# Patient Record
Sex: Male | Born: 1985 | Hispanic: Yes | Marital: Married | Smoking: Never smoker
Health system: Southern US, Community
[De-identification: ages and names within clinical notes are randomized; demographics above are authoritative.]

## PROBLEM LIST (undated history)

## (undated) DIAGNOSIS — F419 Anxiety disorder, unspecified: Secondary | ICD-10-CM

## (undated) DIAGNOSIS — I10 Essential (primary) hypertension: Secondary | ICD-10-CM

## (undated) HISTORY — PX: OTHER SURGICAL HISTORY: SHX169

---

## 2020-04-23 ENCOUNTER — Emergency Department
Admission: EM | Admit: 2020-04-23 | Discharge: 2020-04-23 | Disposition: A | Discharge status: Home / Self Care | Payer: Self-pay | Attending: Emergency Medicine | Admitting: Emergency Medicine

## 2020-04-23 ENCOUNTER — Encounter: Payer: Self-pay | Admitting: Emergency Medicine

## 2020-04-23 ENCOUNTER — Other Ambulatory Visit: Payer: Self-pay

## 2020-04-23 DIAGNOSIS — Z79899 Other long term (current) drug therapy: Secondary | ICD-10-CM | POA: Insufficient documentation

## 2020-04-23 DIAGNOSIS — R42 Dizziness and giddiness: Secondary | ICD-10-CM | POA: Insufficient documentation

## 2020-04-23 DIAGNOSIS — I10 Essential (primary) hypertension: Secondary | ICD-10-CM

## 2020-04-23 DIAGNOSIS — Z760 Encounter for issue of repeat prescription: Secondary | ICD-10-CM

## 2020-04-23 DIAGNOSIS — R519 Headache, unspecified: Secondary | ICD-10-CM

## 2020-04-23 HISTORY — DX: Essential (primary) hypertension: I10

## 2020-04-23 MED ORDER — LOSARTAN POTASSIUM 50 MG PO TABS
50.0000 mg | ORAL_TABLET | Freq: Once | ORAL | Status: AC
  Administered 2020-04-23: 50 mg via ORAL
  Filled 2020-04-23: qty 1

## 2020-04-23 MED ORDER — LOSARTAN POTASSIUM 50 MG PO TABS: 50.0000 mg | ORAL_TABLET | Freq: Every day | ORAL | 11 refills | Status: DC

## 2020-04-23 NOTE — ED Notes (Addendum)
In person interpreter requested for assessment. Unable to perform assessment at this time

## 2020-04-23 NOTE — ED Provider Notes (Signed)
[facility] Emergency Department Provider Note  ____________________________________________  Time seen: Approximately 3:11 PM  I have reviewed the triage vital signs and the nursing notes.   HISTORY  Chief Complaint Medication Refill  Interpreter was used  HPI Leanard Kemp is a 34 y.o. male who presents the emergency department complaining of mild headache, dizziness, high blood pressure.  Patient states that he typically gets headache and dizziness when his blood pressure is up.  He states that he takes losartan, 50 mg daily for his blood pressure.  He states that he has been out of his medicine for 2 weeks and he feels that this is likely causing his headache.  Patient denies any other complaints of fevers or chills, nasal congestion, sore throat, cough, shortness of breath, chest pain, abdominal pain, nausea vomiting.  Patient is requesting refill of his blood pressure medication at this time.         Past Medical History:  Diagnosis Date  . Hypertension     There are no problems to display for this patient.   History reviewed. No pertinent surgical history.  Prior to Admission medications   Medication Sig Start Date End Date Taking? Authorizing Provider  losartan (COZAAR) 50 MG tablet Take 1 tablet (50 mg total) by mouth daily. 04/23/20 04/23/21  Tonji Elliff, Delorise Royals, PA-C    Allergies Patient has no known allergies.  History reviewed. No pertinent family history.  Social History Social History   Tobacco Use  . Smoking status: Never Smoker  . Smokeless tobacco: Never Used  Substance Use Topics  . Alcohol use: Not Currently  . Drug use: Not on file     Review of Systems  Constitutional: No fever/chills Eyes: No visual changes. No discharge ENT: No upper respiratory complaints. Cardiovascular: no chest pain.  Hypertension, out of blood pressure medication Respiratory: no cough. No SOB. Gastrointestinal: No abdominal pain.   No nausea, no vomiting.  No diarrhea.  No constipation. Musculoskeletal: Negative for musculoskeletal pain. Skin: Negative for rash, abrasions, lacerations, ecchymosis. Neurological: Positive for headache but denies focal weakness or numbness.  10 System ROS otherwise negative.  ____________________________________________   PHYSICAL EXAM:  VITAL SIGNS: ED Triage Vitals [04/23/20 1450]  Enc Vitals Group     BP (!) 160/117     Pulse Rate 74     Resp 18     Temp 98.6 F (37 C)     Temp Source Oral     SpO2 100 %     Weight 224 lb 13.9 oz (102 kg)     Height 6' 0.84" (1.85 m)     Head Circumference      Peak Flow      Pain Score 0     Pain Loc      Pain Edu?      Excl. in GC?      Constitutional: Alert and oriented. Well appearing and in no acute distress. Eyes: Conjunctivae are normal. PERRL. EOMI. Head: Atraumatic. ENT:      Ears:       Nose: No congestion/rhinnorhea.      Mouth/Throat: Mucous membranes are moist.  Neck: No stridor.  No cervical spine tenderness to palpation. Hematological/Lymphatic/Immunilogical: No cervical lymphadenopathy. Cardiovascular: Normal rate, regular rhythm. Normal S1 and S2.  Good peripheral circulation. Respiratory: Normal respiratory effort without tachypnea or retractions. Lungs CTAB. Good air entry to the bases with no decreased or absent breath sounds. Musculoskeletal: Full range of motion to all extremities. No gross  deformities appreciated. Neurologic:  Normal speech and language. No gross focal neurologic deficits are appreciated.  Cranial nerves II through XII grossly intact.  Negative Romberg's and pronator drift.  Equal grip strength in the upper extremities. Skin:  Skin is warm, dry and intact. No rash noted. Psychiatric: Mood and affect are normal. Speech and behavior are normal. Patient exhibits appropriate insight and judgement.   ____________________________________________   LABS (all labs ordered are listed, but only  abnormal results are displayed)  Labs Reviewed - No data to display ____________________________________________  EKG   ____________________________________________  RADIOLOGY   No results found.  ____________________________________________    PROCEDURES  Procedure(s) performed:    Procedures    Medications  losartan (COZAAR) tablet 50 mg (has no administration in time range)     ____________________________________________   INITIAL IMPRESSION / ASSESSMENT AND PLAN / ED COURSE  Pertinent labs & imaging results that were available during my care of the patient were reviewed by me and considered in my medical decision making (see chart for details).  Review of the [place] CSRS was performed in accordance of the [facility] prior to dispensing any controlled drugs.           Patient's diagnosis is consistent with headache, hypertension.  Patient presents emergency department complaining of headache and high blood pressure.  Patient states that he gets a headache when he does not take his blood pressure medications.  Patient is out of his losartan for the past 2 weeks.  No recent head trauma.  No other signs or symptoms.  Neuro exam is reassuring.  No indication for labs or imaging currently.  Patient will be placed on his losartan dose of 50 mg daily.  First dose of medicine given here.  Patient did have an elevated blood pressure of 160/117.  No evidence of hypertensive urgency or emergency on physical exam or based off of symptoms..  Patient will be prescribed losartan with 11 refills.  Follow-up with primary care.  Patient is given ED precautions to return to the ED for any worsening or new symptoms.     ____________________________________________  FINAL CLINICAL IMPRESSION(S) / ED DIAGNOSES  Final diagnoses:  Primary hypertension  Encounter for medication refill  Acute nonintractable headache, unspecified headache type      NEW MEDICATIONS STARTED DURING THIS  VISIT:  ED Discharge Orders         Ordered    losartan (COZAAR) 50 MG tablet  Daily        04/23/20 1519              This chart was dictated using voice recognition software/Dragon. Despite best efforts to proofread, errors can occur which can change the meaning. Any change was purely unintentional.    Racheal Patches, PA-C 04/23/20 1520    Arnaldo Natal, MD 04/23/20 [phone]

## 2020-04-23 NOTE — ED Triage Notes (Signed)
Pt comes into the ED via POV c/o need for BP medication refill.  Pt states he has been out for 2 weeks.

## 2021-06-15 ENCOUNTER — Emergency Department (HOSPITAL_COMMUNITY)
Admission: EM | Admit: 2021-06-15 | Discharge: 2021-06-16 | Disposition: A | Discharge status: Home / Self Care | Payer: Self-pay | Attending: Emergency Medicine | Admitting: Emergency Medicine

## 2021-06-15 ENCOUNTER — Encounter (HOSPITAL_COMMUNITY): Payer: Self-pay

## 2021-06-15 ENCOUNTER — Other Ambulatory Visit: Payer: Self-pay

## 2021-06-15 DIAGNOSIS — Z79899 Other long term (current) drug therapy: Secondary | ICD-10-CM | POA: Insufficient documentation

## 2021-06-15 DIAGNOSIS — I10 Essential (primary) hypertension: Secondary | ICD-10-CM | POA: Insufficient documentation

## 2021-06-15 DIAGNOSIS — I158 Other secondary hypertension: Secondary | ICD-10-CM | POA: Insufficient documentation

## 2021-06-15 LAB — BASIC METABOLIC PANEL
Anion gap: 12 (ref 5–15)
BUN: 15 mg/dL (ref 6–20)
CO2: 23 mmol/L (ref 22–32)
Calcium: 9.2 mg/dL (ref 8.9–10.3)
Chloride: 102 mmol/L (ref 98–111)
Creatinine, Ser: 0.88 mg/dL (ref 0.61–1.24)
GFR, Estimated: >60 mL/min (ref >60)
Glucose, Bld: 85 mg/dL (ref 70–99)
Potassium: 3.5 mmol/L (ref 3.5–5.1)
Sodium: 137 mmol/L (ref 135–145)

## 2021-06-15 LAB — CBC WITH DIFFERENTIAL/PLATELET
Abs Immature Granulocytes: 0.03 10*3/uL (ref 0.00–0.07)
Basophils Absolute: 0.2 10*3/uL — ABNORMAL HIGH (ref 0.0–0.1)
Basophils Relative: 2 %
Eosinophils Absolute: 0.1 10*3/uL (ref 0.0–0.5)
Eosinophils Relative: 1 %
HCT: 51.1 % (ref 39.0–52.0)
Hemoglobin: 17.1 g/dL — ABNORMAL HIGH (ref 13.0–17.0)
Immature Granulocytes: 0 %
Lymphocytes Relative: 31 %
Lymphs Abs: 2.7 10*3/uL (ref 0.7–4.0)
MCH: 29 pg (ref 26.0–34.0)
MCHC: 33.5 g/dL (ref 30.0–36.0)
MCV: 86.6 fL (ref 80.0–100.0)
Monocytes Absolute: 0.6 10*3/uL (ref 0.1–1.0)
Monocytes Relative: 6 %
Neutro Abs: 5.1 10*3/uL (ref 1.7–7.7)
Neutrophils Relative %: 60 %
Platelets: 341 10*3/uL (ref 150–400)
RBC: 5.9 MIL/uL — ABNORMAL HIGH (ref 4.22–5.81)
RDW: 12.6 % (ref 11.5–15.5)
WBC: 8.7 10*3/uL (ref 4.0–10.5)
nRBC: 0 % (ref 0.0–0.2)

## 2021-06-15 NOTE — ED Provider Triage Note (Signed)
Emergency Medicine Provider Triage Evaluation Note  Christine Schiefelbein , a 36 y.o. male  was evaluated in triage.  Pt complains of elevated blood pressure since being out of his medications x1 month.  States that he takes 50 mg losartan.  He does not have a PCP to follow-up with.  He states that he was prescribed high blood pressure medication from the hospital after moving here from [place].  Chart review he was seen at [facility] on 04/23/2020 and prescribed 11 refills of losartan.  He states he has intermittent lightheadedness however denies any headache, vision changes, room spinning dizziness, unilateral weakness or numbness, chest pain, shortness of breath.   Review of Systems  Positive: + elevated BP, lightheadedness Negative: - chest pain, SOB, headache, vision changes  Physical Exam  BP (!) 175/112    Pulse 86    Temp 98.2 F (36.8 C)    Resp 16    Ht 6' 0.84" (1.85 m)    Wt 112 kg    SpO2 99%    BMI 32.72 kg/m  Gen:   Awake, no distress   Resp:  Normal effort  MSK:   Moves extremities without difficulty  Other:    Medical Decision Making  Medically screening exam initiated at 5:41 PM.  Appropriate orders placed.  Irvin Bastin was informed that the remainder of the evaluation will be completed by another provider, this initial triage assessment does not replace that evaluation, and the importance of remaining in the ED until their evaluation is complete.     Tanda Rockers, PA-C 06/15/21 1744

## 2021-06-15 NOTE — ED Triage Notes (Signed)
Patient c/o hypertension. Patient states he has been out of his BP medication x 1 month. Patient takes Losartan 50 mg daily. Patient states his prescription expired. Patient also c/o dizziness and fatigue.

## 2021-06-16 MED ORDER — LOSARTAN POTASSIUM 25 MG PO TABS
50.0000 mg | ORAL_TABLET | Freq: Once | ORAL | Status: AC
  Administered 2021-06-16: 50 mg via ORAL
  Filled 2021-06-16: qty 2

## 2021-06-16 MED ORDER — LOSARTAN POTASSIUM 50 MG PO TABS: 50.0000 mg | ORAL_TABLET | Freq: Every day | ORAL | 0 refills | Status: DC

## 2021-06-16 NOTE — ED Provider Notes (Signed)
[facility]-EMERGENCY DEPT Provider Note  CSN: [id number] Arrival date & time: 06/15/21 1717  Chief Complaint(s) Hypertension  HPI Jordan Kemp is a 36 y.o. male without insurance and a history of hypertension who is been off of his Cozaar for the past 20 to 30 days due to not having access to healthcare.  Patient is here for elevated blood pressures.  Denies any headache, nausea, vomiting, chest pain, shortness of breath.  Reports that he tried calling community care clinics to get an appointment but was unable to get 1 until March.   Hypertension   Past Medical History Past Medical History:  Diagnosis Date   Hypertension    There are no problems to display for this patient.  Home Medication(s) Prior to Admission medications   Medication Sig Start Date End Date Taking? Authorizing Provider  losartan (COZAAR) 50 MG tablet Take 1 tablet (50 mg total) by mouth daily. 06/16/21 09/14/21  Nira Conn, MD                                                                                                                                    Allergies Patient has no known allergies.  Review of Systems Review of Systems As noted in HPI  Physical Exam Vital Signs  I have reviewed the triage vital signs BP (!) 152/100    Pulse (!) 58    Temp 98.2 F (36.8 C)    Resp 16 Comment: Simultaneous filing. User may not have seen previous data.   Ht 6' 0.84" (1.85 m)    Wt 112 kg    SpO2 99%    BMI 32.72 kg/m   Physical Exam Vitals reviewed.  Constitutional:      General: He is not in acute distress.    Appearance: He is well-developed. He is not diaphoretic.  HENT:     Head: Normocephalic and atraumatic.     Nose: Nose normal.  Eyes:     General: No scleral icterus.       Right eye: No discharge.        Left eye: No discharge.     Conjunctiva/sclera: Conjunctivae normal.     Pupils: Pupils are equal, round, and reactive to light.  Cardiovascular:      Rate and Rhythm: Normal rate and regular rhythm.     Heart sounds: No murmur heard.   No friction rub. No gallop.  Pulmonary:     Effort: Pulmonary effort is normal. No respiratory distress.     Breath sounds: Normal breath sounds. No stridor. No rales.  Abdominal:     General: There is no distension.     Palpations: Abdomen is soft.     Tenderness: There is no abdominal tenderness.  Musculoskeletal:        General: No tenderness.     Cervical back: Normal range of motion and neck supple.  Skin:  General: Skin is warm and dry.     Findings: No erythema or rash.  Neurological:     Mental Status: He is alert and oriented to person, place, and time.    ED Results and Treatments Labs (all labs ordered are listed, but only abnormal results are displayed) Labs Reviewed  CBC WITH DIFFERENTIAL/PLATELET - Abnormal; Notable for the following components:      Result Value   RBC 5.90 (*)    Hemoglobin 17.1 (*)    Basophils Absolute 0.2 (*)    All other components within normal limits  BASIC METABOLIC PANEL                                                                                                                         EKG  EKG Interpretation  Date/Time:    Ventricular Rate:    PR Interval:    QRS Duration:   QT Interval:    QTC Calculation:   R Axis:     Text Interpretation:         Radiology No results found.  Pertinent labs & imaging results that were available during my care of the patient were reviewed by me and considered in my medical decision making (see MDM for details).  Medications Ordered in ED Medications  losartan (COZAAR) tablet 50 mg (50 mg Oral Given 06/16/21 0205)                                                                                                                                     Procedures Procedures  (including critical care time)  Medical Decision Making / ED Course        Hypertension Asymptomatic Usually controlled  with 50 mg of Cozaar.  Has been out of it for the past 20 to 30 days due to lack of access to healthcare. Patient seen in the MSE process and screening labs obtain to assess for any renal sufficiency that would be concerning for endorgan damage.  Work-up ordered to assess concerns above.  Labs independently interpreted by me and noted below: BMP reassuring without renal insufficiency or electrolyte derangement  Management: Given her oral dose of his home medication Stable for discharge home with a prescription until March.  Reassessment: BP improved   Final Clinical Impression(s) / ED Diagnoses Final diagnoses:  Other secondary hypertension   The patient appears reasonably screened and/or stabilized for  discharge and I doubt any other medical condition or other [facility] requiring further screening, evaluation, or treatment in the ED at this time prior to discharge. Safe for discharge with strict return precautions.  Disposition: Discharge  Condition: Good  I have discussed the results, Dx and Tx plan with the patient/family who expressed understanding and agree(s) with the plan. Discharge instructions discussed at length. The patient/family was given strict return precautions who verbalized understanding of the instructions. No further questions at time of discharge.    ED Discharge Orders          Ordered    losartan (COZAAR) 50 MG tablet  Daily        06/16/21 0215             Follow Up: [facility] [address] [place] [id number] [phone]  Schedule an appointment as soon as possible for a visit  to schedule an appointment for close follow up           This chart was dictated using voice recognition software.  Despite best efforts to proofread,  errors can occur which can change the documentation meaning.    Nira Connardama, Cyenna Rebello Eduardo, MD 06/16/21 [phone]

## 2021-08-21 ENCOUNTER — Emergency Department: Payer: Self-pay

## 2021-08-21 ENCOUNTER — Encounter: Payer: Self-pay | Admitting: Intensive Care

## 2021-08-21 ENCOUNTER — Emergency Department
Admission: EM | Admit: 2021-08-21 | Discharge: 2021-08-21 | Disposition: A | Discharge status: Home / Self Care | Payer: Self-pay | Attending: Emergency Medicine | Admitting: Emergency Medicine

## 2021-08-21 ENCOUNTER — Other Ambulatory Visit: Payer: Self-pay

## 2021-08-21 DIAGNOSIS — I10 Essential (primary) hypertension: Secondary | ICD-10-CM | POA: Insufficient documentation

## 2021-08-21 DIAGNOSIS — F419 Anxiety disorder, unspecified: Secondary | ICD-10-CM | POA: Insufficient documentation

## 2021-08-21 DIAGNOSIS — Z711 Person with feared health complaint in whom no diagnosis is made: Secondary | ICD-10-CM | POA: Insufficient documentation

## 2021-08-21 DIAGNOSIS — K2960 Other gastritis without bleeding: Secondary | ICD-10-CM | POA: Insufficient documentation

## 2021-08-21 LAB — HEPATIC FUNCTION PANEL
ALT: 102 U/L — ABNORMAL HIGH (ref 0–44)
AST: 49 U/L — ABNORMAL HIGH (ref 15–41)
Albumin: 4.4 g/dL (ref 3.5–5.0)
Alkaline Phosphatase: 83 U/L (ref 38–126)
Bilirubin, Direct: <0.1 mg/dL (ref 0.0–0.2)
Total Bilirubin: 0.6 mg/dL (ref 0.3–1.2)
Total Protein: 8.1 g/dL (ref 6.5–8.1)

## 2021-08-21 LAB — HEMOGLOBIN A1C
Hgb A1c MFr Bld: 5.5 % (ref 4.8–5.6)
Mean Plasma Glucose: 111.15 mg/dL

## 2021-08-21 LAB — BASIC METABOLIC PANEL
Anion gap: 9 (ref 5–15)
BUN: 13 mg/dL (ref 6–20)
CO2: 25 mmol/L (ref 22–32)
Calcium: 8.9 mg/dL (ref 8.9–10.3)
Chloride: 107 mmol/L (ref 98–111)
Creatinine, Ser: 0.92 mg/dL (ref 0.61–1.24)
GFR, Estimated: >60 mL/min (ref >60)
Glucose, Bld: 121 mg/dL — ABNORMAL HIGH (ref 70–99)
Potassium: 3.9 mmol/L (ref 3.5–5.1)
Sodium: 141 mmol/L (ref 135–145)

## 2021-08-21 LAB — URINALYSIS, ROUTINE W REFLEX MICROSCOPIC
Bilirubin Urine: NEGATIVE
Glucose, UA: NEGATIVE mg/dL
Hgb urine dipstick: NEGATIVE
Ketones, ur: NEGATIVE mg/dL
Leukocytes,Ua: NEGATIVE
Nitrite: NEGATIVE
Protein, ur: 100 mg/dL — AB
Specific Gravity, Urine: 1.026 (ref 1.005–1.030)
pH: 7 (ref 5.0–8.0)

## 2021-08-21 LAB — CBC
HCT: 47.4 % (ref 39.0–52.0)
Hemoglobin: 15.6 g/dL (ref 13.0–17.0)
MCH: 28.4 pg (ref 26.0–34.0)
MCHC: 32.9 g/dL (ref 30.0–36.0)
MCV: 86.3 fL (ref 80.0–100.0)
Platelets: 401 10*3/uL — ABNORMAL HIGH (ref 150–400)
RBC: 5.49 MIL/uL (ref 4.22–5.81)
RDW: 12.8 % (ref 11.5–15.5)
WBC: 10.2 10*3/uL (ref 4.0–10.5)
nRBC: 0 % (ref 0.0–0.2)

## 2021-08-21 LAB — TSH: TSH: 1.241 u[IU]/mL (ref 0.350–4.500)

## 2021-08-21 LAB — TROPONIN I (HIGH SENSITIVITY)
Troponin I (High Sensitivity): 4 ng/L (ref <18)
Troponin I (High Sensitivity): 4 ng/L (ref <18)

## 2021-08-21 MED ORDER — ALPRAZOLAM 0.25 MG PO TABS: 0.2500 mg | ORAL_TABLET | Freq: Three times a day (TID) | ORAL | 0 refills | Status: AC | PRN

## 2021-08-21 MED ORDER — AMLODIPINE BESYLATE 5 MG PO TABS
5.0000 mg | ORAL_TABLET | Freq: Once | ORAL | Status: AC
  Administered 2021-08-21: 5 mg via ORAL
  Filled 2021-08-21: qty 1

## 2021-08-21 MED ORDER — IOHEXOL 350 MG/ML SOLN
100.0000 mL | Freq: Once | INTRAVENOUS | Status: AC | PRN
  Administered 2021-08-21: 100 mL via INTRAVENOUS
  Filled 2021-08-21: qty 100

## 2021-08-21 MED ORDER — LOSARTAN POTASSIUM 50 MG PO TABS: 50.0000 mg | ORAL_TABLET | Freq: Every day | ORAL | 6 refills | Status: AC

## 2021-08-21 MED ORDER — AMLODIPINE BESYLATE 5 MG PO TABS: 5.0000 mg | ORAL_TABLET | Freq: Every day | ORAL | 6 refills | Status: DC

## 2021-08-21 MED ORDER — FAMOTIDINE 40 MG PO TABS: 40.0000 mg | ORAL_TABLET | Freq: Every day | ORAL | 2 refills | Status: AC

## 2021-08-21 NOTE — ED Triage Notes (Addendum)
Spanish speaking only. Patient reports checking his blood pressure today and it was elevated. Seen at [facility] yesterday and sent home. Was given PO K for slightly low results and states he was told he has liver infection. Patient c/o palpitations in triage. ?

## 2021-08-21 NOTE — ED Notes (Signed)
Pt verbalized understanding of discharge instructions, prescription medications, and follow-up care instructions. Pt advised if symptoms worsen to return to ED. Spanish interpreter was used in discharge teaching.  ?

## 2021-08-21 NOTE — ED Provider Notes (Signed)
? ? ?[facility] ?Emergency Department Provider Note ? ? ? ? Event Date/Time  ? First MD Initiated Contact with Patient 08/21/21 1536   ?  (approximate) ? ? ?History  ? ?Hypertension and Palpitations ? ? ?HPI ? ?History limited by [place] language.  Interpreter Rafael present for interview and exam. ? ?Jordan Kemp is a 36 y.o. male with a history of hypertension, presents to the ED accompanied by his wife.  Patient reports an episode yesterday where he experienced some spasms in his hands and legs, and some numbness across his extremities.  This occurred while he was en route to the hospital in [place], via personal vehicle.  Patient was evaluated in the ED there and found to have chemistry panel revealing slight hyponatremia at 133, and a potassium slightly decreased at 3.3.  Patient was also noted to have elevated liver enzymes with ALT/AST at 111/51, respectively.  Patient has been referred to a GI specialist for follow-up of those labs.  He denies being a regular everyday drinker.  He also denies any excessive use of Tylenol.  Patient also reports some episodes of heart palpitations that seem to occur when he was urinating.  He has been taking losartan for his blood pressure since relocating to the area from [place].  He does note that while he was in [place] he took 100 mg of losartan every day, but since being in the states, is only taken 50 mg daily.  Chart review reveals several ED visits for blood pressure medicine refill.  Patient reports he has just establish care with a local primary care provider, but has not been seen by that provider at this time.  Denies any recent fevers, chills, or sweats.  Denies any chest pain or shortness of breath.  Patient voices some concern at this time for recurrence of the episode experienced yesterday.  This was likely an episode of muscle spasm secondary to his electrolyte abnormality.  Patient received a fluid bolus yesterday along with  a dose of his blood pressure medicine and potassium p.o.  He returns to the ED today after noticing some elevated blood pressure readings today. ? ? ?Physical Exam  ? ?Triage Vital Signs: ?ED Triage Vitals  ?Enc Vitals Group  ?   BP 08/21/21 1524 (!) 173/99  ?   Pulse Rate 08/21/21 1524 89  ?   Resp 08/21/21 1524 18  ?   Temp 08/21/21 1524 99.2 ?F (37.3 ?C)  ?   Temp Source 08/21/21 1524 Oral  ?   SpO2 08/21/21 1524 97 %  ?   Weight 08/21/21 1525 256 lb (116.1 kg)  ?   Height 08/21/21 1525 6' 0.84" (1.85 m)  ?   Head Circumference --   ?   Peak Flow --   ?   Pain Score 08/21/21 1524 6  ?   Pain Loc --   ?   Pain Edu? --   ?   Excl. in GC? --   ? ? ?Most recent vital signs: ?Vitals:  ? 08/21/21 2221 08/21/21 2245  ?BP:  (!) 150/92  ?Pulse: 86 86  ?Resp:  18  ?Temp:    ?SpO2: 100% 98%  ? ? ?General Awake, no distress. Appears diaphoretic ?HEENT NCAT. PERRL. EOMI. No rhinorrhea. Mucous membranes are moist. ?CV:  Good peripheral perfusion. RRR ?RESP:  Normal effort. CTA ?ABD:  No distention. Soft, mildly tender to palp over the RUQ & RLQ. Normal bowel sounds. No rebound, guarding, or rigidity.  ? ? ?  ED Results / Procedures / Treatments  ? ?Labs ?(all labs ordered are listed, but only abnormal results are displayed) ?Labs Reviewed  ?BASIC METABOLIC PANEL - Abnormal; Notable for the following components:  ?    Result Value  ? Glucose, Bld 121 (*)   ? All other components within normal limits  ?CBC - Abnormal; Notable for the following components:  ? Platelets 401 (*)   ? All other components within normal limits  ?URINALYSIS, ROUTINE W REFLEX MICROSCOPIC - Abnormal; Notable for the following components:  ? Color, Urine YELLOW (*)   ? APPearance HAZY (*)   ? Protein, ur 100 (*)   ? Bacteria, UA RARE (*)   ? All other components within normal limits  ?HEPATIC FUNCTION PANEL - Abnormal; Notable for the following components:  ? AST 49 (*)   ? ALT 102 (*)   ? All other components within normal limits  ?TSH  ?HEMOGLOBIN A1C   ?TROPONIN I (HIGH SENSITIVITY)  ?TROPONIN I (HIGH SENSITIVITY)  ? ? ? ?EKG ? ?Vent. rate 83 BPM ?PR interval 154 ms ?QRS duration 100 ms ?QT/QTcB 372/437 ms ?P-R-T axes 55 39 -22 ?No STEMI ? ?RADIOLOGY ? ?I personally viewed and evaluated these images as part of my medical decision making, as well as reviewing the written report by the radiologist. ? ?ED Provider Interpretation: no acute findings} ? ?CXR ? ?IMPRESSION: ?No active cardiopulmonary disease. ? ?US ABD - LIMITED (RUQ) ? ?IMPRESSION: ?Hepatic steatosis. No other significant abnormalities. No cause for ?pain identified ? ?CT ABD/PELVIS w CM ? ?IMPRESSION: ?1. No acute abdominal or pelvic CT findings. ?2. Mildly prominent mildly steatotic liver. No intrahepatic fluid ?collection or mass enhancement. No biliary dilatation. ?3. Constipation with fecal back-up into the terminal ileum. No bowel ?obstruction or inflammation. Uncomplicated diverticulosis. ?4. Edematous umbilical fat hernia.  No bowel incarceration. ?5. Chronic L5 pars defects, grade 1 L5-S1 spondylolisthesis, and ?moderate to severe foraminal stenosis with inferior foraminal ?bulging of the disc on both sides. ? ?PROCEDURES: ? ?Critical Care performed: yes ? ?CRITICAL CARE ?Performed by: Lissa HoardJenise V Bacon-Jeric Slagel ? ? ?Total critical care time: 30 minutes ? ?Critical care time was exclusive of separately billable procedures and treating other patients. ? ?Critical care was necessary to treat or prevent imminent or life-threatening deterioration. ? ?Critical care was time spent personally by me on the following activities: development of treatment plan with patient and/or surrogate as well as nursing, discussions with consultants, evaluation of patient's response to treatment, examination of patient, obtaining history from patient or surrogate, ordering and performing treatments and interventions, ordering and review of laboratory studies, ordering and review of radiographic studies, pulse oximetry  and re-evaluation of patient's condition. ? ? ?Procedures ? ? ?MEDICATIONS ORDERED IN ED: ?Medications  ?iohexol (OMNIPAQUE) 350 MG/ML injection 100 mL (100 mLs Intravenous Contrast Given 08/21/21 2024)  ?amLODipine (NORVASC) tablet 5 mg (5 mg Oral Given 08/21/21 2243)  ? ? ? ?IMPRESSION / MDM / ASSESSMENT AND PLAN / ED COURSE  ?I reviewed the triage vital signs and the nursing notes. ?             ?               ?Differential diagnosis includes, but is not limited to,  ACS, aortic dissection, pulmonary embolism, cardiac tamponade, pneumothorax, pneumonia, pericarditis, myocarditis, GI-related causes including esophagitis/gastritis, and musculoskeletal chest wall pain, acute appendicitis, renal colic, testicular torsion, urinary tract infection/pyelonephritis, prostatitis,  epididymitis, diverticulitis, small bowel obstruction or ileus, colitis,  abdominal aortic aneurysm, gastroenteritis, hernia, etc. ? ?Patient with ED evaluation of elevated blood pressures, episodes of palpitations and concerns over recently found abnormal liver function test.  Patient was evaluated yesterday at [facility] for describes an episode of muscle spasms.  Patient presents today in no acute distress, stable condition, with overall reassuring work-up.  Patient was reevaluated with lab work which included a negative troponin x2 and CBC did not reveal any acute leukocytosis or critical anemia.  Chemistry panel did again reveal a mildly elevated AST/ALT.  Patient was further evaluated with chest x-ray which is negative for any acute intrathoracic process.  Right upper quadrant ultrasound and not reveal any acute cholecystitis or gallstones.  CT scan of the abdomen and pelvis secondary to his complaints of right upper and lower quadrant pain were negative for any acute findings.  Again mild hepatic steatosis was seen.  I spent an excessive amount of time with the patient trying to reassure him of his normal work-up without any acute findings.   No evidence of AMI or malignant arrhythmia.  No evidence of any liver masses or pneumonia.  Patient repeatedly asked questions about how he would manage his blood pressure, how he would be followed up

## 2021-08-21 NOTE — ED Notes (Signed)
Pt presents to the ED with c/o palpitations. Pt states he was seen yesterday for the same issue. Pt states when he gets up and urinate he feels like his heart is racing. Pt states he dies have a hx of HTN.  ?

## 2021-08-21 NOTE — ED Notes (Signed)
Pt transported to ultrasound.

## 2021-08-21 NOTE — Discharge Instructions (Addendum)
Su examen, laboratorios, radiograf?a de t?rax, electrocardiograma y tomograf?a computarizada son todos normales hoy. Comience el nuevo medicamento para la presi?n arterial seg?n las indicaciones. DEBE tomar su medicamento para la presi?n arterial todos los d?as. Seguimiento con su nuevo m?dico primario local o [facility]. Debe controlar su presi?n arterial en casa. Regrese al [employer] [facility] seg?n sea necesario. ? ?Your exam, labs, chest XR, EKG, and CT scan are all normal today. Start the new blood pressure medicine as directed. You MUST take you blood pressure medicine every day. Follow-up with your new local primary doctor or [facility]. You should monitor your blood pressure at home. Return to the Emergency Department as needed.  ?

## 2021-11-09 ENCOUNTER — Emergency Department: Payer: Self-pay

## 2021-11-09 ENCOUNTER — Other Ambulatory Visit: Payer: Self-pay

## 2021-11-09 ENCOUNTER — Encounter: Payer: Self-pay | Admitting: Emergency Medicine

## 2021-11-09 ENCOUNTER — Emergency Department
Admission: EM | Admit: 2021-11-09 | Discharge: 2021-11-09 | Disposition: A | Discharge status: Home / Self Care | Payer: Self-pay | Attending: Emergency Medicine | Admitting: Emergency Medicine

## 2021-11-09 DIAGNOSIS — I10 Essential (primary) hypertension: Secondary | ICD-10-CM

## 2021-11-09 DIAGNOSIS — R202 Paresthesia of skin: Secondary | ICD-10-CM | POA: Insufficient documentation

## 2021-11-09 DIAGNOSIS — Z79899 Other long term (current) drug therapy: Secondary | ICD-10-CM | POA: Insufficient documentation

## 2021-11-09 LAB — BASIC METABOLIC PANEL
Anion gap: 7 (ref 5–15)
BUN: 16 mg/dL (ref 6–20)
CO2: 27 mmol/L (ref 22–32)
Calcium: 9 mg/dL (ref 8.9–10.3)
Chloride: 104 mmol/L (ref 98–111)
Creatinine, Ser: 0.79 mg/dL (ref 0.61–1.24)
GFR, Estimated: >60 mL/min (ref >60)
Glucose, Bld: 114 mg/dL — ABNORMAL HIGH (ref 70–99)
Potassium: 3.6 mmol/L (ref 3.5–5.1)
Sodium: 138 mmol/L (ref 135–145)

## 2021-11-09 LAB — CBC
HCT: 49.2 % (ref 39.0–52.0)
Hemoglobin: 16.1 g/dL (ref 13.0–17.0)
MCH: 28.1 pg (ref 26.0–34.0)
MCHC: 32.7 g/dL (ref 30.0–36.0)
MCV: 86 fL (ref 80.0–100.0)
Platelets: 372 10*3/uL (ref 150–400)
RBC: 5.72 MIL/uL (ref 4.22–5.81)
RDW: 12.7 % (ref 11.5–15.5)
WBC: 7.4 10*3/uL (ref 4.0–10.5)
nRBC: 0 % (ref 0.0–0.2)

## 2021-11-09 LAB — TROPONIN I (HIGH SENSITIVITY): Troponin I (High Sensitivity): 4 ng/L (ref <18)

## 2021-11-09 MED ORDER — METOPROLOL TARTRATE 25 MG PO TABS
25.0000 mg | ORAL_TABLET | Freq: Once | ORAL | Status: AC
  Administered 2021-11-09: 25 mg via ORAL
  Filled 2021-11-09: qty 1

## 2021-11-09 MED ORDER — METOPROLOL TARTRATE 25 MG PO TABS: 25.0000 mg | ORAL_TABLET | Freq: Two times a day (BID) | ORAL | 0 refills | Status: DC

## 2021-11-09 NOTE — ED Triage Notes (Signed)
Info obtained via [place] interpreter.Patient reports that he started feeling dizzy, short of breath and faint.

## 2021-11-09 NOTE — Discharge Instructions (Signed)
For your blood pressure:  CONTINUE the Losartan 50 mg tablets. You can increase this to 100 mg daily IF your blood pressure remains elevated after starting the new medication.  STOP the Amlodipine as this does not seem to be helping.  REPLACE the Amlodipine with METOPROLOL, which I've prescribed. This medication helps both with blood pressure and anxiety/stress. Take this in the mornings and afternoons.  Follow-up with a PCP in the next 1 week

## 2021-11-09 NOTE — ED Provider Notes (Signed)
[facility] Provider Note    Event Date/Time   First MD Initiated Contact with Patient 11/09/21 [phone]     (approximate)   History   No chief complaint on file.   HPI  Jordan Kemp is a 36 y.o. male  with PMHx HTN here with high blood pressure, tingling in hands and feet, SOB. Pt reports that he had difficulty sleeping last night due to stress. He woke up this AM and started going to work. He has been taking his losartan and amlodipine as rx'ed. Reports that on the way to work, he began to feel a mild headache, shortness of breath, and tingling in in his hands and feet. He has a h/o similar sx in setting of high BP in the past. No chest pain or palpitations. No personal h/o CAD. No drug use. No regular alcohol or tobacco use. Currently feels back to baseline.       Physical Exam   Triage Vital Signs: ED Triage Vitals  Enc Vitals Group     BP 11/09/21 0633 (!) 155/98     Pulse Rate 11/09/21 0633 81     Resp 11/09/21 0633 18     Temp 11/09/21 0633 97.6 F (36.4 C)     Temp Source 11/09/21 0633 Oral     SpO2 11/09/21 0633 100 %     Weight 11/09/21 0630 229 lb 4.5 oz (104 kg)     Height --      Head Circumference --      Peak Flow --      Pain Score --      Pain Loc --      Pain Edu? --      Excl. in GC? --     Most recent vital signs: Vitals:   11/09/21 0633 11/09/21 0934  BP: (!) 155/98 (!) 148/90  Pulse: 81 78  Resp: 18 18  Temp: 97.6 F (36.4 C)   SpO2: 100% 100%     General: Awake, no distress.  CV:  Good peripheral perfusion. RRR. 2+ radial and DP pulses bilaterally. Resp:  Normal effort. Lungs CTAB. Abd:  No distention. No tenderness. Other:  CN intact. Strength 5/5 bl UE and LE. Normal sensation to light touch. Normal gait.   ED Results / Procedures / Treatments   Labs (all labs ordered are listed, but only abnormal results are displayed) Labs Reviewed  BASIC METABOLIC PANEL - Abnormal; Notable for the following  components:      Result Value   Glucose, Bld 114 (*)    All other components within normal limits  CBC  URINALYSIS, ROUTINE W REFLEX MICROSCOPIC  TROPONIN I (HIGH SENSITIVITY)     EKG Normal sinus rhythm, VR 77. PR 162, QRS 100, QTc 418. No acute ST elevations or depressions. NO ischemia or infarct. TWI in inferior and lateral precordial leads.   RADIOLOGY CXR: Clear, no cardiomegaly, no focal findings   I also independently reviewed and agree with radiologist interpretations.   PROCEDURES:  Critical Care performed: No    MEDICATIONS ORDERED IN ED: Medications  metoprolol tartrate (LOPRESSOR) tablet 25 mg (25 mg Oral Given 11/09/21 0806)     IMPRESSION / MDM / ASSESSMENT AND PLAN / ED COURSE  I reviewed the triage vital signs and the nursing notes.  Ddx:  Differential includes the following, with pertinent life- or limb-threatening emergencies considered:  Symptomatic HTN, anxiety/panic attack, neuropathy, ACS, PTX/atlectasis, anemia  Patient's presentation is most consistent with acute illness / injury with system symptoms.  MDM:  36 yo M with h/o HTN here with mild SOB, tingling in hands and feet in setting of increasing BPs at home. Pt overall well appearing on exam. EKG is nonischemic and trop is negative, do not suspect ACS. CBC withotu anemia or leukocytosis. BMP shows normal renal function. CXR reviewed and is negative. Pt does have multiple recent stressors, and I suspect this could be contributing to both his high BP as well as possible anxiety-related sx. He was recently started on Amlodipine and feels like it is not working. Discussed risks/benefits of med changes, will trial metoprolol as this may also help with anxiety component, and have him decrease his Amlodipine at home if his BP improves. Return precautions given. No other apparent emergent pathology.   MEDICATIONS GIVEN IN ED: Medications  metoprolol tartrate  (LOPRESSOR) tablet 25 mg (25 mg Oral Given 11/09/21 8563)     Consults:  None   EMR reviewed  Prior ED visits for HTN noted     FINAL CLINICAL IMPRESSION(S) / ED DIAGNOSES   Final diagnoses:  Primary hypertension     Rx / DC Orders   ED Discharge Orders          Ordered    metoprolol tartrate (LOPRESSOR) 25 MG tablet  2 times daily        11/09/21 1010             Note:  This document was prepared using Dragon voice recognition software and may include unintentional dictation errors.   Shaune Pollack, MD 11/09/21 1011

## 2021-11-13 ENCOUNTER — Encounter: Payer: Self-pay | Admitting: Emergency Medicine

## 2021-11-13 ENCOUNTER — Emergency Department: Payer: Self-pay

## 2021-11-13 ENCOUNTER — Emergency Department
Admission: EM | Admit: 2021-11-13 | Discharge: 2021-11-13 | Disposition: A | Discharge status: Home / Self Care | Payer: Self-pay | Attending: Emergency Medicine | Admitting: Emergency Medicine

## 2021-11-13 DIAGNOSIS — I10 Essential (primary) hypertension: Secondary | ICD-10-CM | POA: Insufficient documentation

## 2021-11-13 HISTORY — DX: Anxiety disorder, unspecified: F41.9

## 2021-11-13 LAB — CBC
HCT: 43.2 % (ref 39.0–52.0)
Hemoglobin: 14.4 g/dL (ref 13.0–17.0)
MCH: 28.4 pg (ref 26.0–34.0)
MCHC: 33.3 g/dL (ref 30.0–36.0)
MCV: 85.2 fL (ref 80.0–100.0)
Platelets: 359 10*3/uL (ref 150–400)
RBC: 5.07 MIL/uL (ref 4.22–5.81)
RDW: 12.5 % (ref 11.5–15.5)
WBC: 10.4 10*3/uL (ref 4.0–10.5)
nRBC: 0 % (ref 0.0–0.2)

## 2021-11-13 LAB — BASIC METABOLIC PANEL
Anion gap: 8 (ref 5–15)
BUN: 18 mg/dL (ref 6–20)
CO2: 26 mmol/L (ref 22–32)
Calcium: 9 mg/dL (ref 8.9–10.3)
Chloride: 105 mmol/L (ref 98–111)
Creatinine, Ser: 0.93 mg/dL (ref 0.61–1.24)
GFR, Estimated: >60 mL/min (ref >60)
Glucose, Bld: 111 mg/dL — ABNORMAL HIGH (ref 70–99)
Potassium: 3.2 mmol/L — ABNORMAL LOW (ref 3.5–5.1)
Sodium: 139 mmol/L (ref 135–145)

## 2021-11-13 LAB — TROPONIN I (HIGH SENSITIVITY): Troponin I (High Sensitivity): 3 ng/L (ref <18)

## 2021-11-13 NOTE — ED Provider Notes (Signed)
[facility] Provider Note    Event Date/Time   First MD Initiated Contact with Patient 11/13/21 2029     (approximate)   History   Chest Pain  Spanish interpreter used HPI  Jordan Kemp is a 36 y.o. male with a history of high blood pressure who presents with complaints of high blood pressure and chest discomfort.  Patient reports earlier today he checked his blood pressure which he does twice a day and found it to be elevated with 143/85, he became concerned that his blood pressure was high and became anxious.  He did take his second dose of metoprolol for the day but describes a burning sensation in his epigastrium.  Currently feels asymptomatic and feels improved     Physical Exam   Triage Vital Signs: ED Triage Vitals  Enc Vitals Group     BP 11/13/21 1958 (!) 154/88     Pulse Rate 11/13/21 1958 83     Resp 11/13/21 1958 20     Temp 11/13/21 1958 99 F (37.2 C)     Temp Source 11/13/21 1958 Oral     SpO2 11/13/21 1958 99 %     Weight 11/13/21 1957 103 kg (227 lb 1.2 oz)     Height 11/13/21 1957 1.829 m (6')     Head Circumference --      Peak Flow --      Pain Score 11/13/21 1957 3     Pain Loc --      Pain Edu? --      Excl. in GC? --     Most recent vital signs: Vitals:   11/13/21 2045 11/13/21 2200  BP:  (!) 134/94  Pulse: 82 69  Resp: (!) 21 (!) 22  Temp:    SpO2: 97% 98%     General: Awake, no distress.  CV:  Good peripheral perfusion.  Regular rate and rhythm Resp:  Normal effort.  CTA bilaterally Abd:  No distention.  Other:  No calf pain or   ED Results / Procedures / Treatments   Labs (all labs ordered are listed, but only abnormal results are displayed) Labs Reviewed  BASIC METABOLIC PANEL - Abnormal; Notable for the following components:      Result Value   Potassium 3.2 (*)    Glucose, Bld 111 (*)    All other components within normal limits  CBC  TROPONIN I (HIGH SENSITIVITY)     EKG  ED  ECG REPORT I, Jene Every, the attending physician, personally viewed and interpreted this ECG.  Date: 11/13/2021  Rhythm: normal sinus rhythm QRS Axis: normal Intervals: normal ST/T Wave abnormalities: normal Narrative Interpretation: no evidence of acute ischemia    RADIOLOGY Chest x-ray viewed interpret by me, no acute abnormality    PROCEDURES:  Critical Care performed:   Procedures   MEDICATIONS ORDERED IN ED: Medications - No data to display   IMPRESSION / MDM / ASSESSMENT AND PLAN / ED COURSE  I reviewed the triage vital signs and the nursing notes. Patient's presentation is most consistent with acute presentation with potential threat to life or bodily function.  Patient presents with complaints of elevated blood pressure and chest discomfort  Differential includes ACS, anxiety, hypertension  EKG is quite reassuring, exam is unremarkable, patient is comfortable, high sensitive troponin is normal  Lab work reviewed demonstrates normal CBC, normal BMP  Chest x-ray without evidence of edema or cardiomegaly  Reassurance provided, I suspect primarily anxiety as  the cause of his presentation, blood pressure has improved.  Recommend continuing his blood pressure medications, outpatient follow-up with PCP for blood pressure adjustment        FINAL CLINICAL IMPRESSION(S) / ED DIAGNOSES   Final diagnoses:  Primary hypertension     Rx / DC Orders   ED Discharge Orders     None        Note:  This document was prepared using Dragon voice recognition software and may include unintentional dictation errors.   Jene Every, MD 11/13/21 2235

## 2021-11-13 NOTE — ED Triage Notes (Addendum)
Pt presents via EMS with complaints of CP and HTN. Pt was seen here a few days ago for same and had his Metoprolol dose changed but hasn't had enough time to take effect. Pts BP with EMS was 173/96 and repeated it was 200/108. He notes taking his BP meds ~30 mins ago - pt has anxiety which he thinks makes his BP higher. Denies SOB, N/V, or headache.

## 2021-11-14 ENCOUNTER — Emergency Department
Admission: EM | Admit: 2021-11-14 | Discharge: 2021-11-14 | Disposition: A | Discharge status: Home / Self Care | Payer: Self-pay | Attending: Emergency Medicine | Admitting: Emergency Medicine

## 2021-11-14 ENCOUNTER — Other Ambulatory Visit: Payer: Self-pay

## 2021-11-14 DIAGNOSIS — I10 Essential (primary) hypertension: Secondary | ICD-10-CM | POA: Insufficient documentation

## 2021-11-14 DIAGNOSIS — R002 Palpitations: Secondary | ICD-10-CM | POA: Insufficient documentation

## 2021-11-14 DIAGNOSIS — Z79899 Other long term (current) drug therapy: Secondary | ICD-10-CM | POA: Insufficient documentation

## 2021-11-14 LAB — TROPONIN I (HIGH SENSITIVITY): Troponin I (High Sensitivity): 3 ng/L (ref <18)

## 2021-11-14 LAB — BASIC METABOLIC PANEL
Anion gap: 10 (ref 5–15)
BUN: 12 mg/dL (ref 6–20)
CO2: 23 mmol/L (ref 22–32)
Calcium: 8.8 mg/dL — ABNORMAL LOW (ref 8.9–10.3)
Chloride: 107 mmol/L (ref 98–111)
Creatinine, Ser: 0.75 mg/dL (ref 0.61–1.24)
GFR, Estimated: >60 mL/min (ref >60)
Glucose, Bld: 135 mg/dL — ABNORMAL HIGH (ref 70–99)
Potassium: 3.6 mmol/L (ref 3.5–5.1)
Sodium: 140 mmol/L (ref 135–145)

## 2021-11-14 LAB — CBC
HCT: 45.5 % (ref 39.0–52.0)
Hemoglobin: 15 g/dL (ref 13.0–17.0)
MCH: 28.4 pg (ref 26.0–34.0)
MCHC: 33 g/dL (ref 30.0–36.0)
MCV: 86 fL (ref 80.0–100.0)
Platelets: 358 10*3/uL (ref 150–400)
RBC: 5.29 MIL/uL (ref 4.22–5.81)
RDW: 12.5 % (ref 11.5–15.5)
WBC: 8.4 10*3/uL (ref 4.0–10.5)
nRBC: 0 % (ref 0.0–0.2)

## 2021-11-14 LAB — TSH: TSH: 1.172 u[IU]/mL (ref 0.350–4.500)

## 2021-11-14 LAB — MAGNESIUM: Magnesium: 2.1 mg/dL (ref 1.7–2.4)

## 2021-11-14 LAB — T4, FREE: Free T4: 1.04 ng/dL (ref 0.61–1.12)

## 2021-11-14 MED ORDER — METOPROLOL TARTRATE 25 MG PO TABS: 25.0000 mg | ORAL_TABLET | Freq: Two times a day (BID) | ORAL | 0 refills | Status: AC

## 2021-11-14 NOTE — ED Triage Notes (Signed)
Pt to ED stating has been having heart palpitations since 30 min and chills since several hours ago. States every time his BP goes up, he gets chills. Denies NVD and fevers. Takes metoprolol and losartan for HTN. Denies CP, SOB.

## 2021-11-14 NOTE — ED Provider Notes (Signed)
[facility] Provider Note    Event Date/Time   First MD Initiated Contact with Patient 11/14/21 1315     (approximate)   History   Palpitations   HPI  Jordan Kemp is a 36 y.o. male with a past medical history of hypertension on losartan and metoprolol as well as some anxiety recently evaluated yesterday in the emergency room for palpitations who presents for evaluation of another episode of palpitations that occurred today.  Patient states he felt like his heart was racing for about 30 minutes after he ate some lunch.  He denies any associated pain, cough, shortness of breath, headache, earache, sore throat, fevers, nausea, vomiting, diarrhea urinary symptoms rash or extremity pain.  States he has been taking his medications as directed.  He denies any caffeine use today or other supplements.  Denies any illicit drug use tobacco abuse or EtOH use.  States he feels back to baseline now.  History of hemoptysis or recent surgery.  No history of DVT or PE.  No swelling or pain in the legs.      Physical Exam  Triage Vital Signs: ED Triage Vitals  Enc Vitals Group     BP 11/14/21 1248 140/79     Pulse Rate 11/14/21 1248 72     Resp 11/14/21 1248 20     Temp 11/14/21 1248 98.8 F (37.1 C)     Temp Source 11/14/21 1248 Oral     SpO2 11/14/21 1248 98 %     Weight 11/14/21 1249 227 lb 1.2 oz (103 kg)     Height 11/14/21 1249 6' (1.829 m)     Head Circumference --      Peak Flow --      Pain Score 11/14/21 1249 0     Pain Loc --      Pain Edu? --      Excl. in GC? --     Most recent vital signs: Vitals:   11/14/21 1248  BP: 140/79  Pulse: 72  Resp: 20  Temp: 98.8 F (37.1 C)  SpO2: 98%    General: Awake, no distress.  CV:  Good peripheral perfusion.    Regular rhythm.  No murmur. Resp:  Jordan effort.  Clear bilaterally. Abd:  No distention.  Soft. Other:     ED Results / Procedures / Treatments  Labs (all labs ordered are listed,  but only abnormal results are displayed) Labs Reviewed  BASIC METABOLIC PANEL - Abnormal; Notable for the following components:      Result Value   Glucose, Bld 135 (*)    Calcium 8.8 (*)    All other components within Jordan limits  CBC  TSH  T4, FREE  MAGNESIUM  TROPONIN I (HIGH SENSITIVITY)     EKG  ECG shows sinus rhythm with a ventricular rate of 70, Jordan axis, unremarkable intervals with nonspecific ST change in lead III and V3 without any other clear evidence of acute ischemia or significant arrhythmia.  These changes appear identical from ECG yesterday.   RADIOLOGY Chest x-ray obtained during yesterday's ED visit interpreted by myself without evidence of CHF, pneumonia, effusion, edema pneumothorax or any other clear acute process.  PROCEDURES:  Critical Care performed: No  Procedures    MEDICATIONS ORDERED IN ED: Medications - No data to display   IMPRESSION / MDM / ASSESSMENT AND PLAN / ED COURSE  I reviewed the triage vital signs and the nursing notes. Patient's presentation is most consistent  with acute presentation with potential threat to life or bodily function.                               Differential diagnosis includes, but is not limited to paroxysmal tachyarrhythmia, anemia, anabolic derangements, hypothyroidism with low suspicion for ACS or ischemia given unchanged ECG today compared to yesterday and nonelevated troponin.  He has no respiratory symptoms or evidence of respiratory distress and Jordan lung exam given reassuring x-ray yesterday I do not believe there is much utility in repeating x-ray today.  Patient is PERC negative and I have low suspicion for PE.  BMP obtained today shows no significant electrolyte or metabolic derangements.  CBC shows no leukocytosis or acute anemia.  TSH and free T4 WNL.  Magnesium is WNL.  Counseled patient on talking his PCP about his anxiety see if he needs some additional management of this from specialist.   Given stable vitals with reassuring exam and work-up and patient having no symptoms throughout his stay in the emergency room at this point I think is stable for discharge with continued outpatient evaluation.  Rx for metoprolol refilled as requested.  Discharged in stable condition.  Strict return precautions advised and discussed      FINAL CLINICAL IMPRESSION(S) / ED DIAGNOSES   Final diagnoses:  Palpitations     Rx / DC Orders   ED Discharge Orders          Ordered    metoprolol tartrate (LOPRESSOR) 25 MG tablet  2 times daily        11/14/21 1429             Note:  This document was prepared using Dragon voice recognition software and may include unintentional dictation errors.   Gilles Chiquito, MD 11/14/21 [phone]

## 2022-12-24 IMAGING — CT CT ABD-PELV W/ CM
2 of 4 series · 15 of 46 positions shown, 17 images · IV contrast (APPLIED)
Comparison: Right upper quadrant ultrasound earlier today is the
only relevant prior. Study demonstrated hepatic steatosis with no
other significant findings.

CLINICAL DATA: Right lower quadrant pain. Recent diagnosis
hypokalemia and "liver infection."

EXAM:
CT ABDOMEN AND PELVIS WITH CONTRAST
TECHNIQUE: Multidetector CT imaging of the abdomen and pelvis was performed
using the standard protocol following bolus administration of
intravenous contrast.

[Series 2: routine abd/pel with · axial · 0.93mm/px · z∈[-687,-152]mm · 12 of 119 slices shown, 14 images]
[im 6/119  soft-tissue]
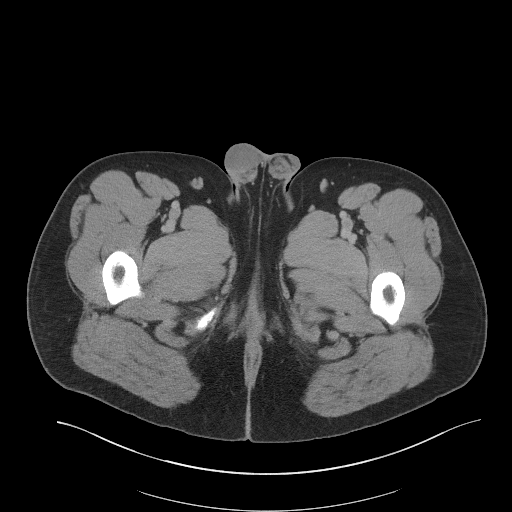
[im 6/119  bone]
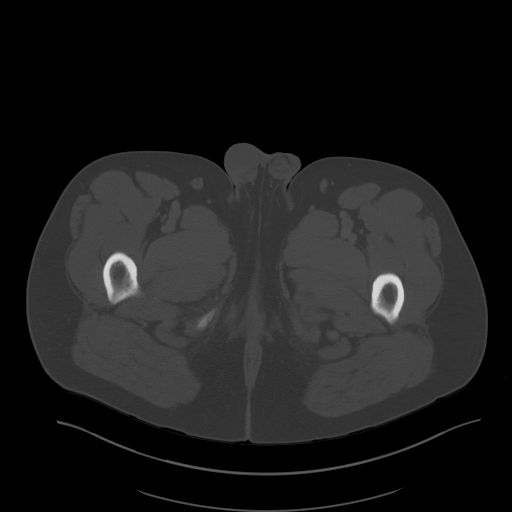
[im 17/119  soft-tissue]
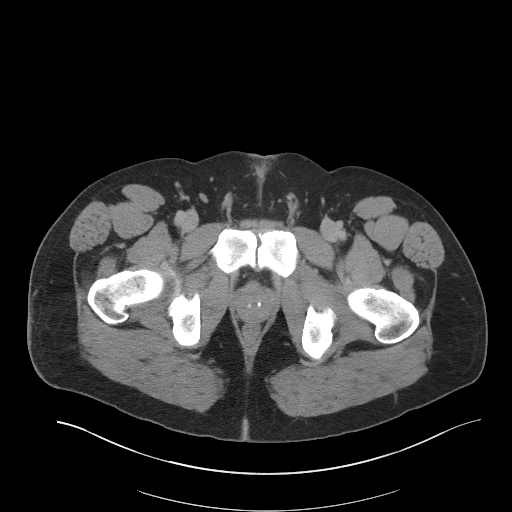
[im 27/119  soft-tissue]
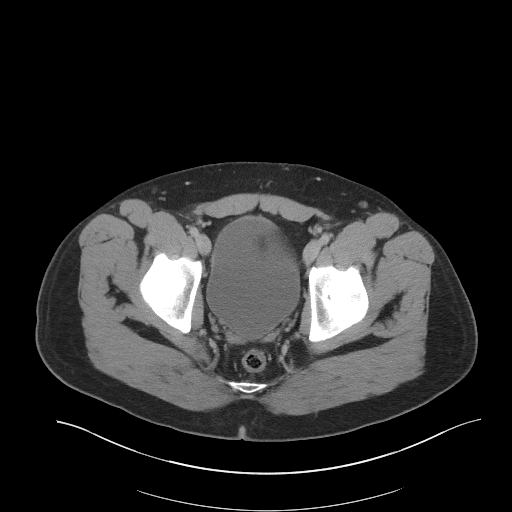
[im 38/119  soft-tissue]
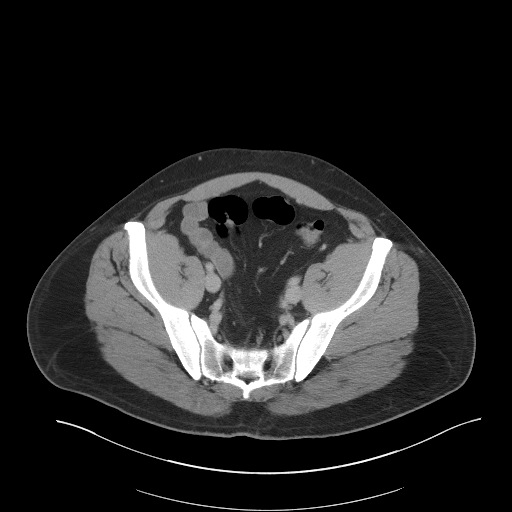
[im 43/119  soft-tissue]
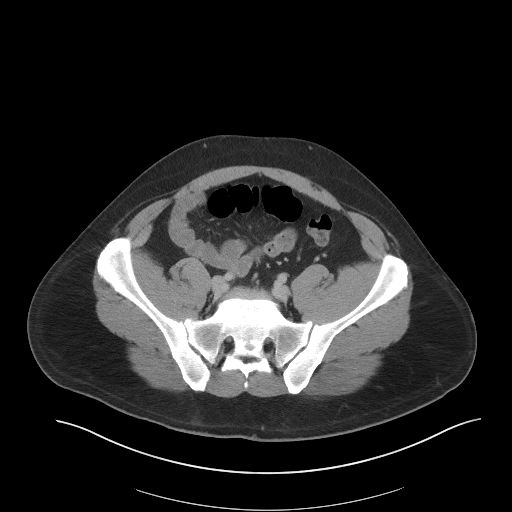
[im 54/119  soft-tissue]
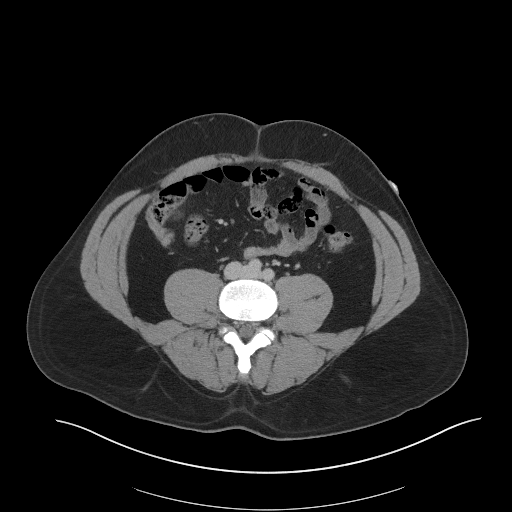
[im 65/119  soft-tissue]
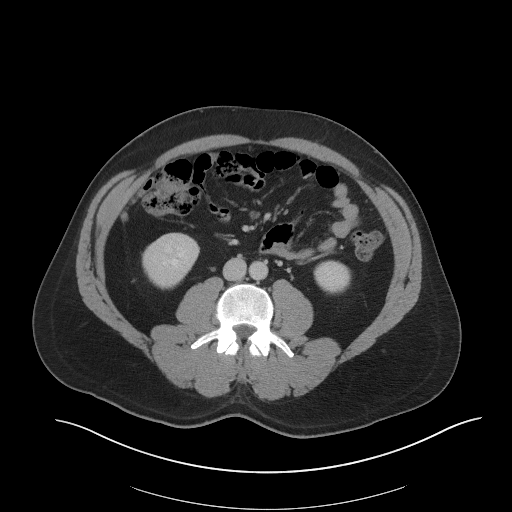
[im 76/119  soft-tissue]
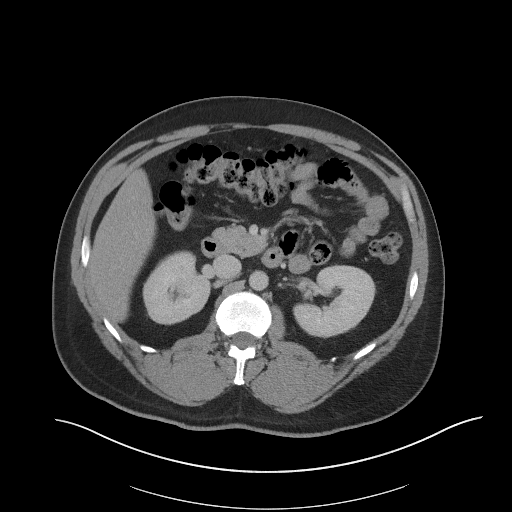
[im 81/119  soft-tissue]
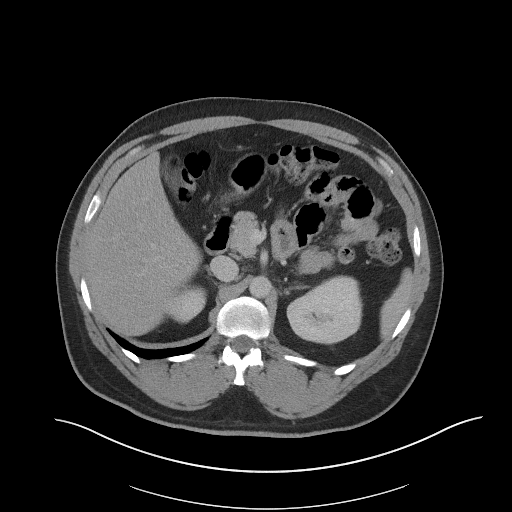
[im 81/119  bone]
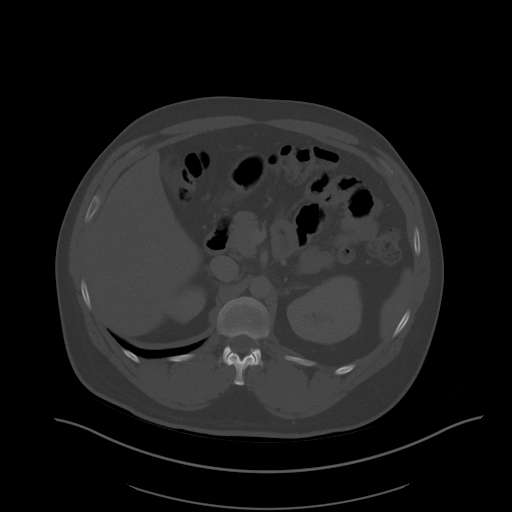
[im 92/119  soft-tissue]
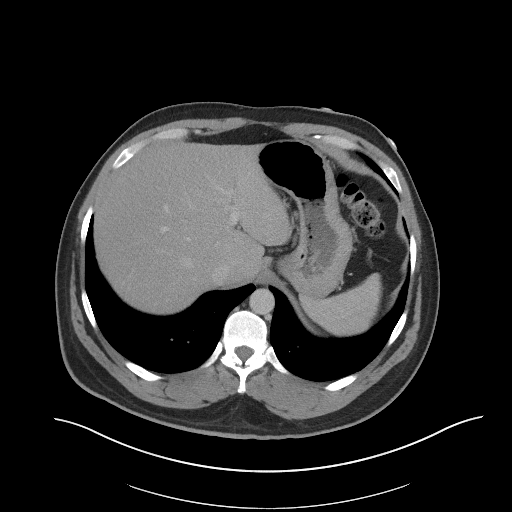
[im 102/119  soft-tissue]
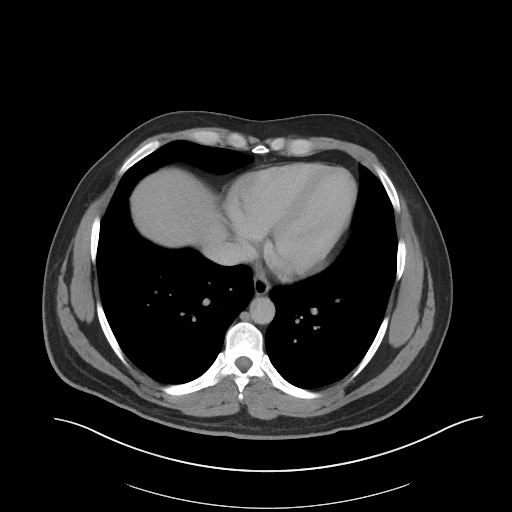
[im 113/119  soft-tissue]
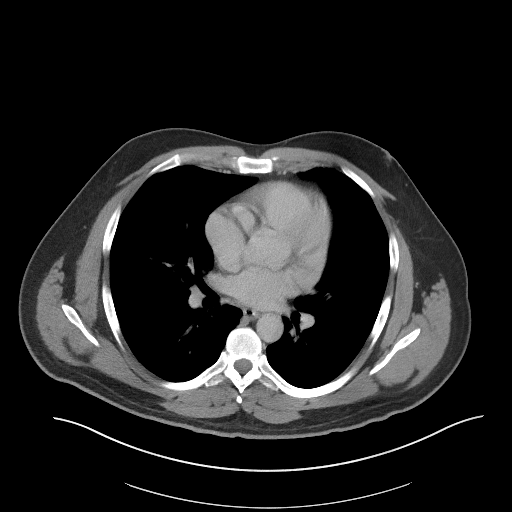

[Series 5: coronal st · coronal · 0.87mm/px · 3 of 103 slices shown]
[im 35/103  soft-tissue]
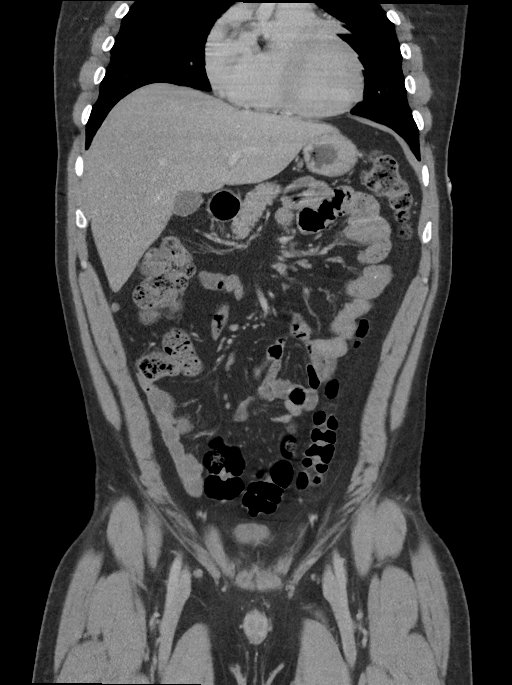
[im 46/103  soft-tissue]
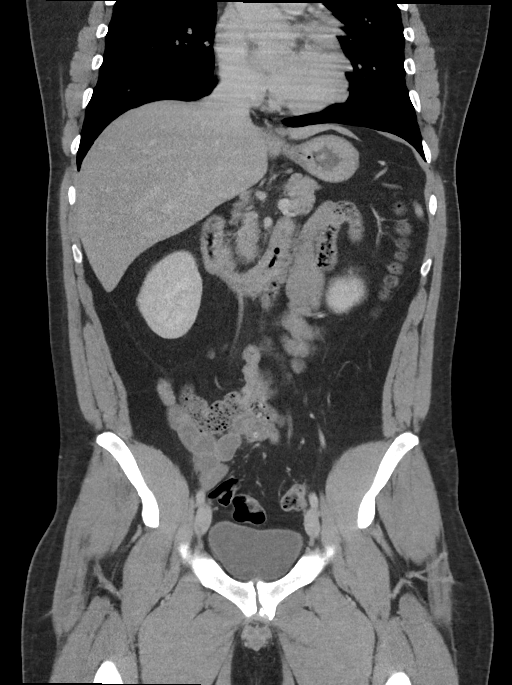
[im 57/103  soft-tissue]
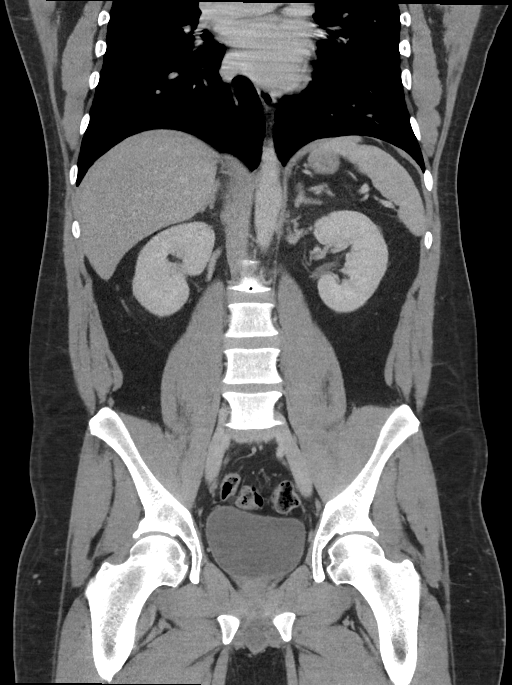

[15 of 46 positions shown; findings below may reference images not displayed]

RADIATION DOSE REDUCTION: This exam was performed according to the
departmental dose-optimization program which includes automated
exposure control, adjustment of the mA and/or kV according to
patient size and/or use of iterative reconstruction technique.

CONTRAST:  100mL OMNIPAQUE IOHEXOL 350 MG/ML SOLN
FINDINGS: Lower chest: No acute abnormality.

Hepatobiliary: 19 cm in length mildly steatotic liver. No mass
enhancement or collections within the liver. The gallbladder and
bile ducts unremarkable.

Pancreas: Unremarkable.

Spleen: Unremarkable , normal in size.

Adrenals/Urinary Tract: There is no adrenal mass no focal
abnormality of the renal cortex. No evidence of urinary stones or
obstruction. Normal bladder thickness and lumen.

Stomach/Bowel: No dilatation or wall thickening including the
appendix. There is mild-to-moderate fecal stasis, including with
fecal back up into the terminal ileum. There are scattered
diverticula without evidence of diverticulitis or focal colitis.

Vascular/Lymphatic: No significant vascular findings or abdominal or
pelvic adenopathy. Incidental note circumaortic left renal vein.

Reproductive: Normal prostatic size with central calcifications
incidentally seen.

Other: Small edematous umbilical fat hernia. No incarcerated hernia.
No inguinal hernia.

Musculoskeletal: There are L5 pars defects with a chronic appearance
and grade 1 L5-S1 spondylolisthesis, foraminal disc bulging and
moderate to severe bilateral L5-S1 foraminal stenosis. There are
spurs at the pubic symphysis.
IMPRESSION: 1. No acute abdominal or pelvic CT findings.
2. Mildly prominent mildly steatotic liver. No intrahepatic fluid
collection or mass enhancement. No biliary dilatation.
3. Constipation with fecal back-up into the terminal ileum. No bowel
obstruction or inflammation. Uncomplicated diverticulosis.
4. Edematous umbilical fat hernia.  No bowel incarceration.
5. Chronic L5 pars defects, grade 1 L5-S1 spondylolisthesis, and
moderate to severe foraminal stenosis with inferior foraminal
bulging of the disc on both sides.

## 2023-03-18 IMAGING — CR DG CHEST 2V
2 series · 2 of 2 positions shown · non-contrast
Comparison: Chest radiograph dated 11/09/2021.

CLINICAL DATA: Chest pain.

EXAM:
CHEST - 2 VIEW

[chest pa]
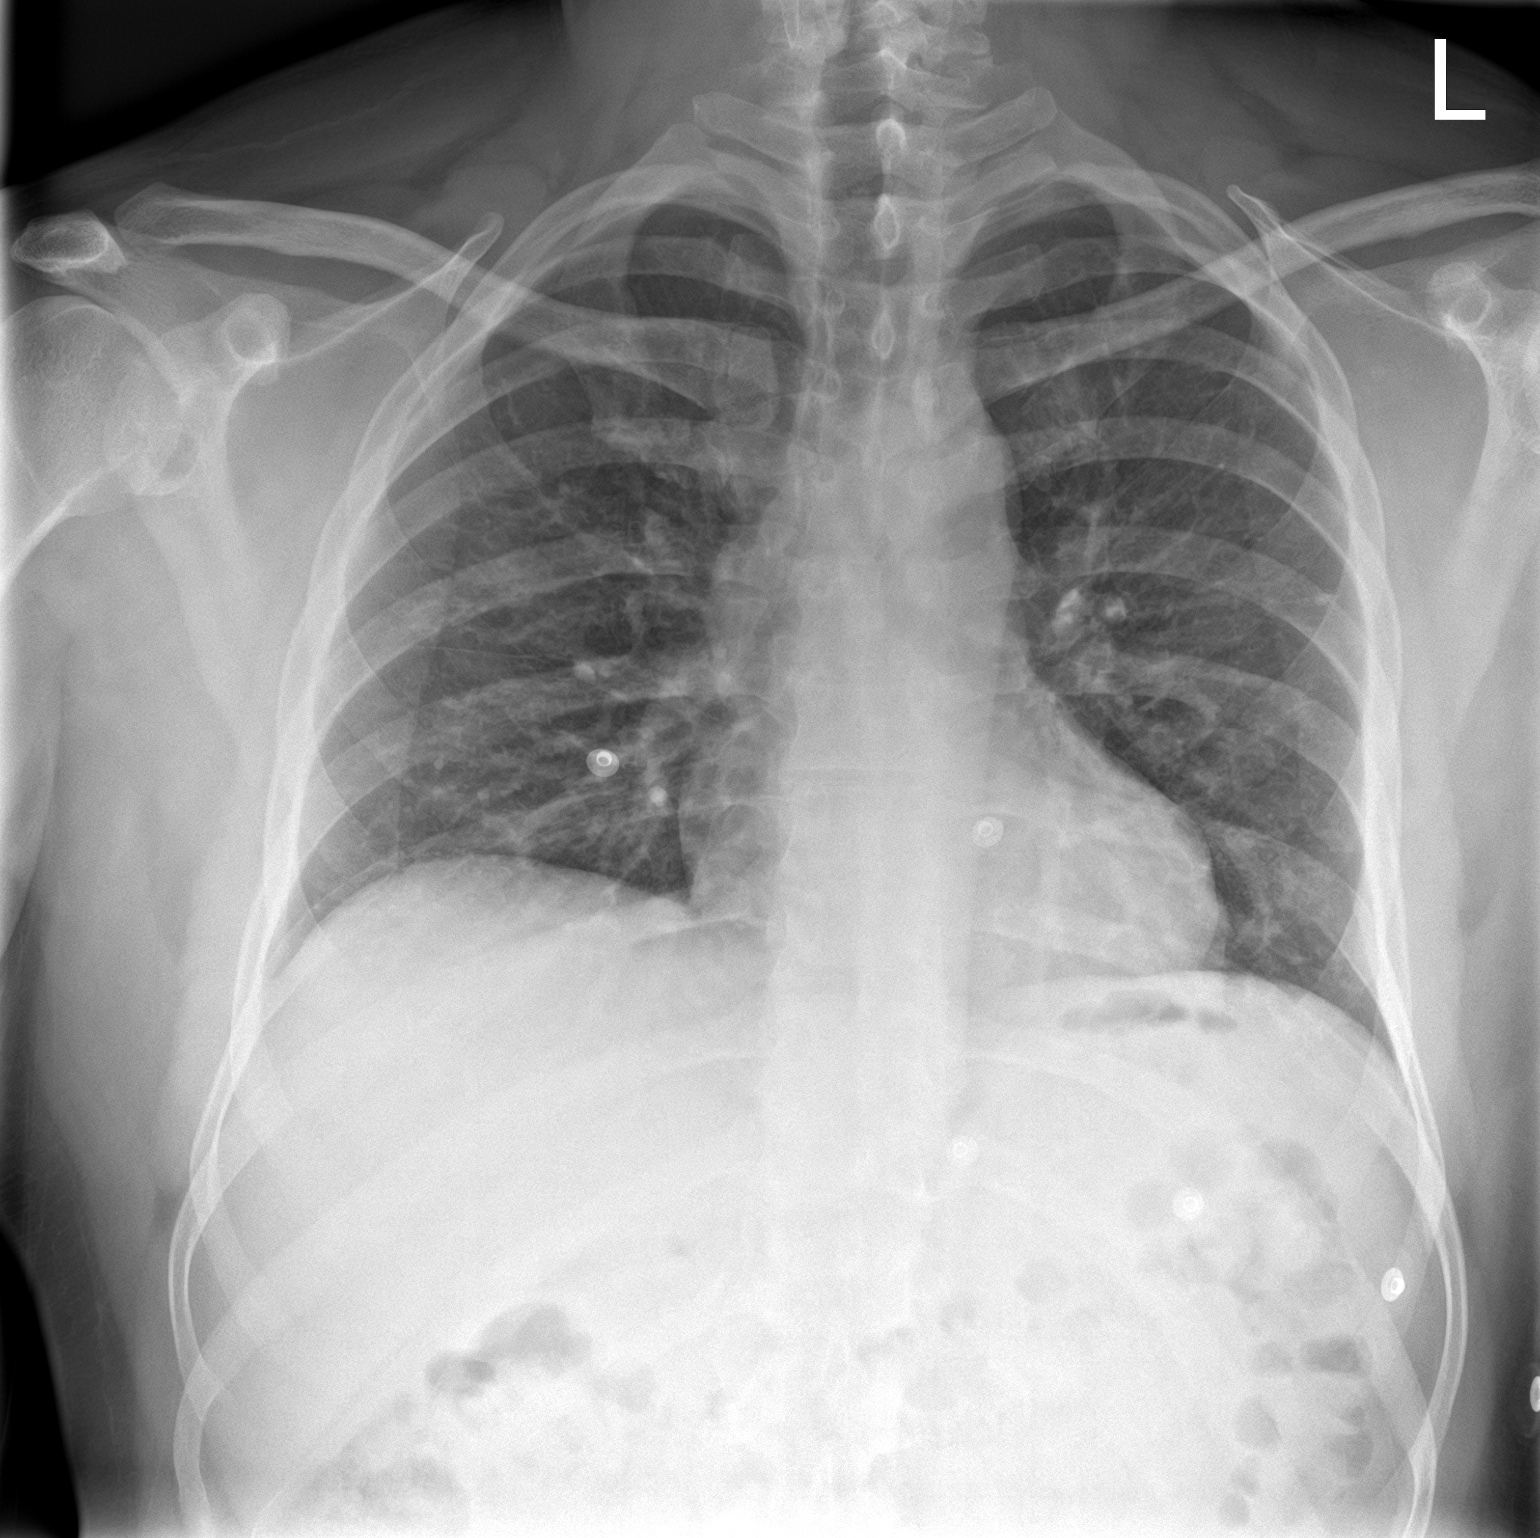

[chest lat]
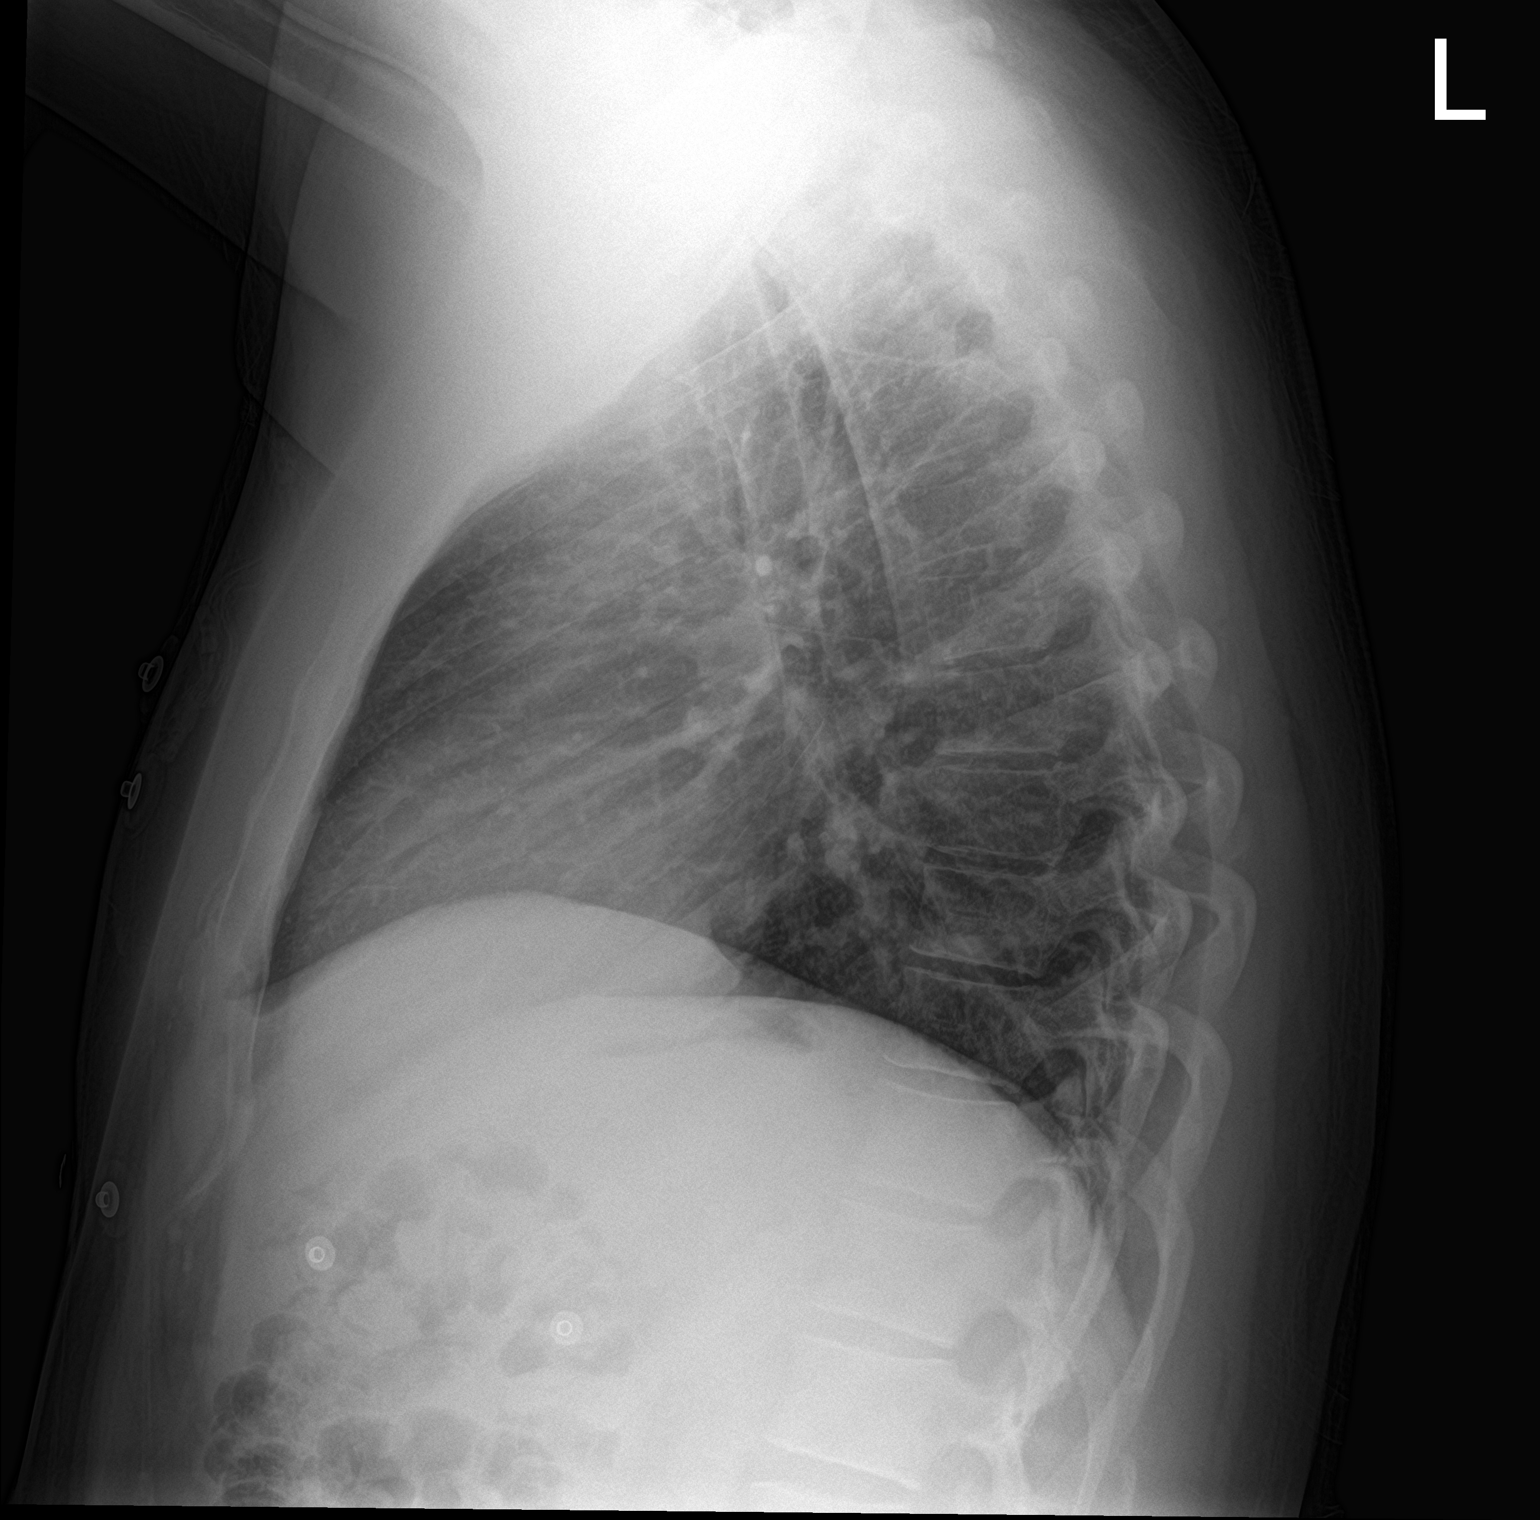

[2 of 2 positions shown; findings below may reference images not displayed]

FINDINGS: No focal consolidation, pleural effusion or pneumothorax. Mild
chronic bronchitic changes. The cardiac silhouette is within normal
limits. No acute osseous pathology
IMPRESSION: No active cardiopulmonary disease.
